# Patient Record
Sex: Male | Born: 1940
Health system: Southern US, Community
[De-identification: ages and names within clinical notes are randomized; demographics above are authoritative.]

## PROBLEM LIST (undated history)

## (undated) DIAGNOSIS — L02215 Cutaneous abscess of perineum: Secondary | ICD-10-CM

## (undated) DIAGNOSIS — H579 Unspecified disorder of eye and adnexa: Secondary | ICD-10-CM

## (undated) HISTORY — PX: ABSCESS DRAINAGE: SHX1119

## (undated) HISTORY — DX: Cutaneous abscess of perineum: L02.215

## (undated) HISTORY — DX: Unspecified disorder of eye and adnexa: H57.9

## (undated) HISTORY — PX: EYE SURGERY: SHX253

---

## 2010-12-26 ENCOUNTER — Ambulatory Visit (INDEPENDENT_AMBULATORY_CARE_PROVIDER_SITE_OTHER): Payer: Medicare Other | Admitting: Surgery

## 2010-12-26 ENCOUNTER — Encounter (INDEPENDENT_AMBULATORY_CARE_PROVIDER_SITE_OTHER): Payer: Self-pay | Admitting: Surgery

## 2010-12-26 VITALS — BP 118/64 | HR 56 | Temp 97.4°F | Resp 16 | Ht 71.0 in | Wt 180.0 lb

## 2010-12-26 DIAGNOSIS — K612 Anorectal abscess: Secondary | ICD-10-CM

## 2010-12-26 DIAGNOSIS — K611 Rectal abscess: Secondary | ICD-10-CM | POA: Insufficient documentation

## 2010-12-26 MED ORDER — HYDROCODONE-ACETAMINOPHEN 10-325 MG PO TABS: 1.0000 | ORAL_TABLET | Freq: Four times a day (QID) | ORAL | Status: AC | PRN

## 2010-12-26 NOTE — Patient Instructions (Addendum)
Peri-Rectal Abscess (Abscess Around the Rectum)  See Handouts.  Call if worse  Your caregiver has diagnosed you as having a peri-rectal abscess. This is an infected area near the rectum that is filled with pus. If the abscess is near the surface of the skin, your caregiver may open (incise) the area and drain the pus. HOME CARE INSTRUCTIONS  If your abscess was opened up and drained. A small piece of gauze may be placed in the opening so that it can drain. Do not remove the gauze unless directed by your caregiver.   A loose dressing may be placed over the abscess site. Change the dressing as often as necessary to keep it clean and dry.   After the drain is removed, the area may be washed with a gentle antiseptic (soap) four times per day.   A warm sitz bath, warm packs or heating pad may be used for pain relief, taking care not to burn yourself.   Return for a wound check in 1 week  An "inflatable doughnut" may be used for sitting with added comfort. These can be purchased at a drugstore or medical supply house.   To reduce pain and straining with bowel movements, eat a high fiber diet with plenty of fruits and vegetables. Use stool softeners as recommended by your caregiver. This is especially important if narcotic type pain medications were prescribed as these may cause marked constipation.   Only take over-the-counter or prescription medicines for pain, discomfort, or fever as directed by your caregiver.  SEEK IMMEDIATE MEDICAL CARE IF:  You have increasing pain that is not controlled by medication.   There is increased inflammation (redness), swelling, bleeding, or drainage from the area.   An oral temperature above 102F develops.   You develop chills or generalized malaise (feel lethargic or feel "washed out").   You develop any new symptoms (problems) you feel may be related to your present problem.  Document Released: 03/16/2000 Document Re-Released: 08/05/2008 [facility]  Patient Information 2011 [place], [place].

## 2010-12-26 NOTE — Progress Notes (Signed)
Subjective:     Patient ID: Christopher Sandoval, male   DOB: [birth year], 70 y.o.   MRN: [id number]  HPI  Reason for visit: Painful draining mass in perineum.  Patient Care Team: Thayer Headings as PCP - General (Internal Medicine)  Patient is a diabetic male who noticed a lump behind his scrotum last week. It was not acutely painful. It was about the size of a Roma tomato. He was hoping it would calm down. It became larger. It started draining. He saw his primary care physician yesterday. He was started on oral antibiotics. He comes today for surgical evaluation. He's never had any history of any abscesses or infections or skin problems in the past. No history of MRSA. Normally has a bowel movement rather regularly.   Past Medical History  Diagnosis Date  . Diabetes mellitus   . Eye pressure     increased  . Perineal abscess     Past Surgical History  Procedure Date  . Eye surgery 2009, 2012    bilateral- cataract, cornea scrape on left    History   Social History  . Marital Status: Married    Spouse Name: N/A    Number of Children: N/A  . Years of Education: N/A   Occupational History  . Not on file.   Social History Main Topics  . Smoking status: Never Smoker   . Smokeless tobacco: Not on file  . Alcohol Use: No  . Drug Use: No  . Sexually Active:    Other Topics Concern  . Not on file   Social History Narrative  . No narrative on file    History reviewed. No pertinent family history.  Current outpatient prescriptions:amoxicillin-clavulanate (AUGMENTIN) 875-125 MG per tablet, Take 1 tablet by mouth 2 (two) times daily.  , Disp: , Rfl: ;  Atorvastatin Calcium (LIPITOR PO), Take by mouth.  , Disp: , Rfl: ;  glyBURIDE (DIABETA) 5 MG tablet, Take 5 mg by mouth daily with breakfast.  , Disp: , Rfl: ;  latanoprost (XALATAN) 0.005 % ophthalmic solution, Place 1 drop into both eyes at bedtime.  , Disp: , Rfl:  lisinopril (PRINIVIL,ZESTRIL) 2.5 MG tablet, Take 2.5 mg by  mouth daily.  , Disp: , Rfl: ;  HYDROcodone-acetaminophen (NORCO) 10-325 MG per tablet, Take 1-2 tablets by mouth every 6 (six) hours as needed for pain., Disp: 30 tablet, Rfl: 0  No Known Allergies        Review of Systems  Constitutional: Negative for fever, chills and diaphoresis.  HENT: Negative for sore throat, trouble swallowing and neck pain.   Eyes: Negative for photophobia and visual disturbance.  Respiratory: Negative for choking and shortness of breath.   Cardiovascular: Negative for chest pain and palpitations.  Gastrointestinal: Negative for nausea, vomiting, abdominal pain, diarrhea, constipation, abdominal distention and anal bleeding.  Genitourinary: Negative for dysuria, urgency, penile swelling, scrotal swelling, difficulty urinating, penile pain and testicular pain.  Musculoskeletal: Negative for myalgias, arthralgias and gait problem.  Skin: Negative for color change and rash.  Neurological: Negative for dizziness, speech difficulty, weakness and numbness.  Hematological: Negative for adenopathy.  Psychiatric/Behavioral: Negative for hallucinations, confusion and agitation.       Objective:   Physical Exam  Constitutional: He is oriented to person, place, and time. He appears well-developed and well-nourished. No distress.  HENT:  Head: Normocephalic.  Mouth/Throat: Oropharynx is clear and moist. No oropharyngeal exudate.  Eyes: Conjunctivae and EOM are normal. Pupils are equal, round, and reactive to light.  No scleral icterus.  Neck: Normal range of motion. No tracheal deviation present.  Cardiovascular: Normal rate and intact distal pulses.   Pulmonary/Chest: Effort normal. No respiratory distress.  Abdominal: Soft. He exhibits no distension and no mass. There is no tenderness. There is no guarding. Hernia confirmed negative in the right inguinal area and confirmed negative in the left inguinal area.       No hernias  Genitourinary: Rectum normal and penis  normal. No penile tenderness.       Left anterior 5x9cm mass +drainage  The anatomy & physiology of the anorectal region was discussed.  The pathophysiology of anorectal abscess and differential diagnosis was discussed.  Natural history progression such as fistula, gangrene, etc was discussed.   I stressed the importance of a bowel regimen to have daily soft bowel movements to minimize progression of disease.     The patient's symptoms are not adequately controlled.  Non-operative treatment has not healed the abscess.  Therefore, I recommended incision & drainage of the abscess to allow the infection to resolve and heal.  Technique, risks, benefits, alternatives discussed.  The patient expressed understanding & wished to proceed.  The patient was positioned lateral decubitus.  I placed a field block with local anaesthetic.  I incised the skin over the abscess to release the infection.  3x5cm cavity.  I excised skin at the wound to have an adequate opening 2x1cm for drainage & prevent skin reclosure.  I packed the wound with ribbon NU-Gauze.  The patient tolerated the procedure.   Educational handouts further explaining the pathology, treatment options, and bowel regimen were given.  We will have the patient return to clinic for close follow up to make sure the infection heals    Musculoskeletal: Normal range of motion. He exhibits no tenderness.  Neurological: He is alert and oriented to person, place, and time. No cranial nerve deficit. He exhibits normal muscle tone. Coordination normal.  Skin: Skin is warm and dry. No rash noted. He is not diaphoretic.  Psychiatric: He has a normal mood and affect. His behavior is normal.       Assessment:     Perirectal abscess in DM patient, drained    Plan:     Wash perineum at least daily. Clean gauze/pads to catch drainage.  Allow packing to fall out over the next few days. Remove it remains greater than 5 days.  Return to clinic in one week to  make sure he is improved.  Complete oral antibiotics.  Return to clinic sooner or call if worsening pain redness or other concerns. I gave him handouts

## 2011-01-01 ENCOUNTER — Ambulatory Visit (INDEPENDENT_AMBULATORY_CARE_PROVIDER_SITE_OTHER): Payer: Medicare Other | Admitting: Surgery

## 2011-01-01 ENCOUNTER — Encounter (INDEPENDENT_AMBULATORY_CARE_PROVIDER_SITE_OTHER): Payer: Self-pay | Admitting: Surgery

## 2011-01-01 VITALS — BP 108/68 | HR 60 | Temp 96.4°F | Resp 16 | Ht 70.0 in | Wt 183.1 lb

## 2011-01-01 DIAGNOSIS — K611 Rectal abscess: Secondary | ICD-10-CM

## 2011-01-01 DIAGNOSIS — K612 Anorectal abscess: Secondary | ICD-10-CM

## 2011-01-01 NOTE — Patient Instructions (Signed)
Dressing Change     Dressings are placed over wounds to keep them clean, dry, and protected from further injury. This provides an environment that favors wound healing. Good wound care includes resting and elevating the injured part until the pain and swelling are better. Change your wound dressing as recommended by your caregiver.     When removing an old dressing, lift it slowly away from the wound. If the dressing sticks to the wound, dampen it with half-strength peroxide or tap water. Clean the wound gently with a moist cloth, remove any loose material, and apply antibiotic ointment if recommended by your caregiver. Usually it is okay for a wound to get wet. Wash it with mildly soapy water. Watch for signs of infection when changing a dressing.     SEEK MEDICAL CARE IF YOU DEVELOP:   Increased pain, redness, or swelling.   Pus-like drainage from the wound.    Fever greater than 100.4 F (38 C).     Document Released: 04/26/2004  Document Re-Released: 06/26/2007  [employer] Patient Information 2011 [employer].

## 2011-01-01 NOTE — Progress Notes (Signed)
Subjective:     Patient ID: Christopher Sandoval, male   DOB: Apr 08, 1940, 70 y.o.   MRN: [id number]  HPI   Reason for visit: Painful draining mass in perineum.  Patient Care Team: Thayer Headings as PCP - General (Internal Medicine)  Diagnosis: Left anterior perirectal abscess  Procedure: Incision and drainage 12/26/2010  Reason for visit: Followup  Patient is a diabetic male who noticed a lump behind his scrotum.  I lanced it last week. I started him on antibiotics. He comes back for a wound check.  He feels much better. He still notes an open wound. Minimal drainage. Regular bowel movements. No fevers chills or sweats. Urinating well. He's to travel to [place] next week and wants to know if that is okay.   Past Medical History  Diagnosis Date  . Diabetes mellitus   . Eye pressure     increased  . Perineal abscess     Past Surgical History  Procedure Date  . Eye surgery 2009, 2012    bilateral- cataract, cornea scrape on left  . Abscess drainage     perirectal     History   Social History  . Marital Status: Married    Spouse Name: N/A    Number of Children: N/A  . Years of Education: N/A   Occupational History  . Not on file.   Social History Main Topics  . Smoking status: Never Smoker   . Smokeless tobacco: Not on file  . Alcohol Use: No  . Drug Use: No  . Sexually Active:    Other Topics Concern  . Not on file   Social History Narrative  . No narrative on file    History reviewed. No pertinent family history.  Current outpatient prescriptions:amoxicillin-clavulanate (AUGMENTIN) 875-125 MG per tablet, Take 1 tablet by mouth 2 (two) times daily.  , Disp: , Rfl: ;  Atorvastatin Calcium (LIPITOR PO), Take by mouth.  , Disp: , Rfl: ;  glyBURIDE (DIABETA) 5 MG tablet, Take 5 mg by mouth daily with breakfast.  , Disp: , Rfl:  HYDROcodone-acetaminophen (NORCO) 10-325 MG per tablet, Take 1-2 tablets by mouth every 6 (six) hours as needed for pain., Disp: 30 tablet,  Rfl: 0;  lisinopril (PRINIVIL,ZESTRIL) 2.5 MG tablet, Take 2.5 mg by mouth daily.  , Disp: , Rfl: ;  latanoprost (XALATAN) 0.005 % ophthalmic solution, Place 1 drop into both eyes at bedtime.  , Disp: , Rfl:   No Known Allergies        Review of Systems  Constitutional: Negative for fever, chills and diaphoresis.  HENT: Negative for sore throat, trouble swallowing and neck pain.   Eyes: Negative for photophobia and visual disturbance.  Respiratory: Negative for choking and shortness of breath.   Cardiovascular: Negative for chest pain and palpitations.  Gastrointestinal: Negative for nausea, vomiting, abdominal pain, diarrhea, constipation, abdominal distention and anal bleeding.  Genitourinary: Negative for dysuria, urgency, penile swelling, scrotal swelling, difficulty urinating, penile pain and testicular pain.  Musculoskeletal: Negative for myalgias, arthralgias and gait problem.  Skin: Negative for color change and rash.  Neurological: Negative for dizziness, speech difficulty, weakness and numbness.  Hematological: Negative for adenopathy.  Psychiatric/Behavioral: Negative for hallucinations, confusion and agitation.       Objective:   Physical Exam  Constitutional: He is oriented to person, place, and time. He appears well-developed and well-nourished. No distress.  HENT:  Head: Normocephalic.  Mouth/Throat: Oropharynx is clear and moist. No oropharyngeal exudate.  Eyes: Conjunctivae and EOM are  normal. Pupils are equal, round, and reactive to light. No scleral icterus.  Neck: Normal range of motion. No tracheal deviation present.  Cardiovascular: Normal rate and intact distal pulses.   Pulmonary/Chest: Effort normal. No respiratory distress.  Abdominal: Soft. He exhibits no distension and no mass. There is no tenderness. There is no guarding. Hernia confirmed negative in the right inguinal area and confirmed negative in the left inguinal area.       No hernias    Genitourinary: Rectum normal and penis normal. No penile tenderness.       2x2cm superficial well-granulating wound left anterior.  Erythema nearly gone  Musculoskeletal: Normal range of motion. He exhibits no tenderness.  Neurological: He is alert and oriented to person, place, and time. No cranial nerve deficit. He exhibits normal muscle tone. Coordination normal.  Skin: Skin is warm and dry. No rash noted. He is not diaphoretic.  Psychiatric: He has a normal mood and affect. His behavior is normal.       Assessment:     Perirectal abscess in DM patient, drained    Plan:     Wash perineum at least daily. Clean gauze/pads to catch drainage.   Return to clinic in 2 weeks to make sure he is improved.  Complete oral antibiotics, last dose tnight.  Return to clinic sooner or call if worsening pain redness or other concerns. I gave him handouts

## 2011-01-15 ENCOUNTER — Encounter (INDEPENDENT_AMBULATORY_CARE_PROVIDER_SITE_OTHER): Payer: Self-pay | Admitting: Surgery

## 2011-01-15 ENCOUNTER — Ambulatory Visit (INDEPENDENT_AMBULATORY_CARE_PROVIDER_SITE_OTHER): Payer: Medicare Other | Admitting: Surgery

## 2011-01-15 VITALS — BP 130/76 | HR 80 | Temp 97.6°F | Resp 18 | Ht 71.0 in | Wt 181.0 lb

## 2011-01-15 DIAGNOSIS — K611 Rectal abscess: Secondary | ICD-10-CM

## 2011-01-15 DIAGNOSIS — K612 Anorectal abscess: Secondary | ICD-10-CM

## 2011-01-15 NOTE — Progress Notes (Signed)
Subjective:     Patient ID: Christopher Sandoval, male   DOB: 10/04/1940, 70 y.o.   MRN: [id number]  HPI   Reason for visit: Painful draining mass in perineum.  Patient Care Team: Thayer Headings as PCP - General (Internal Medicine)  Diagnosis: Left anterior perirectal abscess  Procedure: Incision and drainage 12/26/2010  Reason for visit: Followup  He feels much better. He still notes the wound is nearly closed down. Minimal drainage. Using dry gauze to cover.  Regular bowel movements. No fevers chills or sweats. Urinating well.   Past Medical History  Diagnosis Date  . Diabetes mellitus   . Eye pressure     increased  . Perineal abscess     Past Surgical History  Procedure Date  . Eye surgery 2009, 2012    bilateral- cataract, cornea scrape on left  . Abscess drainage     perirectal     History   Social History  . Marital Status: Married    Spouse Name: N/A    Number of Children: N/A  . Years of Education: N/A   Occupational History  . Not on file.   Social History Main Topics  . Smoking status: Never Smoker   . Smokeless tobacco: Never Used  . Alcohol Use: No  . Drug Use: No  . Sexually Active:    Other Topics Concern  . Not on file   Social History Narrative  . No narrative on file    History reviewed. No pertinent family history.  Current outpatient prescriptions:Atorvastatin Calcium (LIPITOR PO), Take by mouth.  , Disp: , Rfl: ;  glyBURIDE (DIABETA) 5 MG tablet, Take 5 mg by mouth daily with breakfast.  , Disp: , Rfl: ;  HYDROcodone-acetaminophen (NORCO) 10-325 MG per tablet, Take 1-2 tablets by mouth every 6 (six) hours as needed for pain., Disp: 30 tablet, Rfl: 0;  latanoprost (XALATAN) 0.005 % ophthalmic solution, Place 1 drop into both eyes at bedtime.  , Disp: , Rfl:  lisinopril (PRINIVIL,ZESTRIL) 2.5 MG tablet, Take 2.5 mg by mouth daily.  , Disp: , Rfl:   No Known Allergies        Review of Systems  Constitutional: Negative for fever,  chills and diaphoresis.  HENT: Negative for sore throat, trouble swallowing and neck pain.   Eyes: Negative for photophobia and visual disturbance.  Respiratory: Negative for choking and shortness of breath.   Cardiovascular: Negative for chest pain and palpitations.  Gastrointestinal: Negative for nausea, vomiting, abdominal pain, diarrhea, constipation, abdominal distention and anal bleeding.  Genitourinary: Negative for dysuria, urgency, penile swelling, scrotal swelling, difficulty urinating, penile pain and testicular pain.  Musculoskeletal: Negative for myalgias, arthralgias and gait problem.  Skin: Negative for color change and rash.  Neurological: Negative for dizziness, speech difficulty, weakness and numbness.  Hematological: Negative for adenopathy.  Psychiatric/Behavioral: Negative for hallucinations, confusion and agitation.       Objective:   Physical Exam  Constitutional: He is oriented to person, place, and time. He appears well-developed and well-nourished. No distress.  HENT:  Head: Normocephalic.  Mouth/Throat: Oropharynx is clear and moist. No oropharyngeal exudate.  Eyes: Conjunctivae and EOM are normal. Pupils are equal, round, and reactive to light. No scleral icterus.  Neck: Normal range of motion. No tracheal deviation present.  Cardiovascular: Normal rate and intact distal pulses.   Pulmonary/Chest: Effort normal. No respiratory distress.  Abdominal: Soft. He exhibits no distension and no mass. There is no tenderness. There is no guarding. Hernia confirmed negative in  the right inguinal area and confirmed negative in the left inguinal area.       No hernias  Genitourinary: Rectum normal and penis normal. No penile tenderness.       1x1cm superficial nearly epithelialized wound left anterior.  Soft.  No cellulitis/abscess  Musculoskeletal: Normal range of motion. He exhibits no tenderness.  Neurological: He is alert and oriented to person, place, and time. No  cranial nerve deficit. He exhibits normal muscle tone. Coordination normal.  Skin: Skin is warm and dry. No rash noted. He is not diaphoretic.  Psychiatric: He has a normal mood and affect. His behavior is normal.       Assessment:     Perirectal abscess in DM patient, drained.  Nearly closed 3 weeks later    Plan:     Wash perineum at least daily. Clean gauze/pads to catch drainage.   Return to clinic PRN, call if  worsening pain. redness or other concerns.   He expressed understanding and appreciation.

## 2011-04-17 DIAGNOSIS — H4011X0 Primary open-angle glaucoma, stage unspecified: Secondary | ICD-10-CM | POA: Diagnosis not present

## 2011-05-10 DIAGNOSIS — E119 Type 2 diabetes mellitus without complications: Secondary | ICD-10-CM | POA: Diagnosis not present

## 2011-05-10 DIAGNOSIS — E785 Hyperlipidemia, unspecified: Secondary | ICD-10-CM | POA: Diagnosis not present

## 2011-05-11 DIAGNOSIS — H26499 Other secondary cataract, unspecified eye: Secondary | ICD-10-CM | POA: Diagnosis not present

## 2011-05-11 DIAGNOSIS — Z961 Presence of intraocular lens: Secondary | ICD-10-CM | POA: Diagnosis not present

## 2011-05-16 DIAGNOSIS — E119 Type 2 diabetes mellitus without complications: Secondary | ICD-10-CM | POA: Diagnosis not present

## 2011-06-13 DIAGNOSIS — R0602 Shortness of breath: Secondary | ICD-10-CM | POA: Diagnosis not present

## 2011-06-13 DIAGNOSIS — N529 Male erectile dysfunction, unspecified: Secondary | ICD-10-CM | POA: Diagnosis not present

## 2011-06-13 DIAGNOSIS — E785 Hyperlipidemia, unspecified: Secondary | ICD-10-CM | POA: Diagnosis not present

## 2011-06-13 DIAGNOSIS — E119 Type 2 diabetes mellitus without complications: Secondary | ICD-10-CM | POA: Diagnosis not present

## 2011-07-26 DIAGNOSIS — R3129 Other microscopic hematuria: Secondary | ICD-10-CM | POA: Diagnosis not present

## 2011-07-26 DIAGNOSIS — N281 Cyst of kidney, acquired: Secondary | ICD-10-CM | POA: Diagnosis not present

## 2011-08-13 DIAGNOSIS — B070 Plantar wart: Secondary | ICD-10-CM | POA: Diagnosis not present

## 2011-08-21 DIAGNOSIS — E119 Type 2 diabetes mellitus without complications: Secondary | ICD-10-CM | POA: Diagnosis not present

## 2011-08-22 DIAGNOSIS — E119 Type 2 diabetes mellitus without complications: Secondary | ICD-10-CM | POA: Diagnosis not present

## 2011-08-29 DIAGNOSIS — B079 Viral wart, unspecified: Secondary | ICD-10-CM | POA: Diagnosis not present

## 2011-09-05 DIAGNOSIS — R0602 Shortness of breath: Secondary | ICD-10-CM | POA: Diagnosis not present

## 2011-11-16 DIAGNOSIS — H1130 Conjunctival hemorrhage, unspecified eye: Secondary | ICD-10-CM | POA: Diagnosis not present

## 2011-12-14 DIAGNOSIS — E785 Hyperlipidemia, unspecified: Secondary | ICD-10-CM | POA: Diagnosis not present

## 2011-12-14 DIAGNOSIS — Z0000 Encounter for general adult medical examination without abnormal findings: Secondary | ICD-10-CM | POA: Diagnosis not present

## 2011-12-14 DIAGNOSIS — E119 Type 2 diabetes mellitus without complications: Secondary | ICD-10-CM | POA: Diagnosis not present

## 2011-12-14 DIAGNOSIS — E663 Overweight: Secondary | ICD-10-CM | POA: Diagnosis not present

## 2011-12-21 DIAGNOSIS — N529 Male erectile dysfunction, unspecified: Secondary | ICD-10-CM | POA: Diagnosis not present

## 2011-12-21 DIAGNOSIS — E119 Type 2 diabetes mellitus without complications: Secondary | ICD-10-CM | POA: Diagnosis not present

## 2011-12-21 DIAGNOSIS — E785 Hyperlipidemia, unspecified: Secondary | ICD-10-CM | POA: Diagnosis not present

## 2012-01-15 DIAGNOSIS — E119 Type 2 diabetes mellitus without complications: Secondary | ICD-10-CM | POA: Diagnosis not present

## 2012-01-16 DIAGNOSIS — E119 Type 2 diabetes mellitus without complications: Secondary | ICD-10-CM | POA: Diagnosis not present

## 2012-01-25 DIAGNOSIS — Z23 Encounter for immunization: Secondary | ICD-10-CM | POA: Diagnosis not present

## 2012-02-01 DIAGNOSIS — H4011X0 Primary open-angle glaucoma, stage unspecified: Secondary | ICD-10-CM | POA: Diagnosis not present

## 2012-02-01 DIAGNOSIS — H35379 Puckering of macula, unspecified eye: Secondary | ICD-10-CM | POA: Diagnosis not present

## 2012-02-01 DIAGNOSIS — H43819 Vitreous degeneration, unspecified eye: Secondary | ICD-10-CM | POA: Diagnosis not present

## 2012-02-01 DIAGNOSIS — Z09 Encounter for follow-up examination after completed treatment for conditions other than malignant neoplasm: Secondary | ICD-10-CM | POA: Diagnosis not present

## 2012-02-08 DIAGNOSIS — B079 Viral wart, unspecified: Secondary | ICD-10-CM | POA: Diagnosis not present

## 2012-02-08 DIAGNOSIS — L851 Acquired keratosis [keratoderma] palmaris et plantaris: Secondary | ICD-10-CM | POA: Diagnosis not present

## 2012-04-28 DIAGNOSIS — E119 Type 2 diabetes mellitus without complications: Secondary | ICD-10-CM | POA: Diagnosis not present

## 2012-04-28 DIAGNOSIS — H4011X0 Primary open-angle glaucoma, stage unspecified: Secondary | ICD-10-CM | POA: Diagnosis not present

## 2012-05-20 DIAGNOSIS — E119 Type 2 diabetes mellitus without complications: Secondary | ICD-10-CM | POA: Diagnosis not present

## 2012-05-21 DIAGNOSIS — E119 Type 2 diabetes mellitus without complications: Secondary | ICD-10-CM | POA: Diagnosis not present

## 2012-06-16 DIAGNOSIS — H4011X0 Primary open-angle glaucoma, stage unspecified: Secondary | ICD-10-CM | POA: Diagnosis not present

## 2012-07-04 DIAGNOSIS — R197 Diarrhea, unspecified: Secondary | ICD-10-CM | POA: Diagnosis not present

## 2012-07-04 DIAGNOSIS — A088 Other specified intestinal infections: Secondary | ICD-10-CM | POA: Diagnosis not present

## 2012-07-04 DIAGNOSIS — R112 Nausea with vomiting, unspecified: Secondary | ICD-10-CM | POA: Diagnosis not present

## 2012-07-30 DIAGNOSIS — L821 Other seborrheic keratosis: Secondary | ICD-10-CM | POA: Diagnosis not present

## 2012-07-30 DIAGNOSIS — L819 Disorder of pigmentation, unspecified: Secondary | ICD-10-CM | POA: Diagnosis not present

## 2012-07-30 DIAGNOSIS — L570 Actinic keratosis: Secondary | ICD-10-CM | POA: Diagnosis not present

## 2012-07-30 DIAGNOSIS — L253 Unspecified contact dermatitis due to other chemical products: Secondary | ICD-10-CM | POA: Diagnosis not present

## 2012-07-30 DIAGNOSIS — D485 Neoplasm of uncertain behavior of skin: Secondary | ICD-10-CM | POA: Diagnosis not present

## 2012-08-19 DIAGNOSIS — E119 Type 2 diabetes mellitus without complications: Secondary | ICD-10-CM | POA: Diagnosis not present

## 2012-08-19 DIAGNOSIS — E785 Hyperlipidemia, unspecified: Secondary | ICD-10-CM | POA: Diagnosis not present

## 2012-08-28 DIAGNOSIS — E785 Hyperlipidemia, unspecified: Secondary | ICD-10-CM | POA: Diagnosis not present

## 2012-08-28 DIAGNOSIS — N529 Male erectile dysfunction, unspecified: Secondary | ICD-10-CM | POA: Diagnosis not present

## 2012-08-28 DIAGNOSIS — E1129 Type 2 diabetes mellitus with other diabetic kidney complication: Secondary | ICD-10-CM | POA: Diagnosis not present

## 2012-08-28 DIAGNOSIS — N182 Chronic kidney disease, stage 2 (mild): Secondary | ICD-10-CM | POA: Diagnosis not present

## 2012-09-12 DIAGNOSIS — Q828 Other specified congenital malformations of skin: Secondary | ICD-10-CM | POA: Diagnosis not present

## 2012-09-12 DIAGNOSIS — L84 Corns and callosities: Secondary | ICD-10-CM | POA: Diagnosis not present

## 2012-09-29 DIAGNOSIS — H4011X0 Primary open-angle glaucoma, stage unspecified: Secondary | ICD-10-CM | POA: Diagnosis not present

## 2012-12-22 DIAGNOSIS — Z23 Encounter for immunization: Secondary | ICD-10-CM | POA: Diagnosis not present

## 2013-01-19 DIAGNOSIS — E119 Type 2 diabetes mellitus without complications: Secondary | ICD-10-CM | POA: Diagnosis not present

## 2013-01-21 DIAGNOSIS — E119 Type 2 diabetes mellitus without complications: Secondary | ICD-10-CM | POA: Diagnosis not present

## 2013-02-19 DIAGNOSIS — S0500XA Injury of conjunctiva and corneal abrasion without foreign body, unspecified eye, initial encounter: Secondary | ICD-10-CM | POA: Diagnosis not present

## 2013-02-24 DIAGNOSIS — Z125 Encounter for screening for malignant neoplasm of prostate: Secondary | ICD-10-CM | POA: Diagnosis not present

## 2013-02-24 DIAGNOSIS — E119 Type 2 diabetes mellitus without complications: Secondary | ICD-10-CM | POA: Diagnosis not present

## 2013-03-05 DIAGNOSIS — E119 Type 2 diabetes mellitus without complications: Secondary | ICD-10-CM | POA: Diagnosis not present

## 2013-03-09 DIAGNOSIS — E785 Hyperlipidemia, unspecified: Secondary | ICD-10-CM | POA: Diagnosis not present

## 2013-03-09 DIAGNOSIS — E1129 Type 2 diabetes mellitus with other diabetic kidney complication: Secondary | ICD-10-CM | POA: Diagnosis not present

## 2013-03-09 DIAGNOSIS — N182 Chronic kidney disease, stage 2 (mild): Secondary | ICD-10-CM | POA: Diagnosis not present

## 2013-05-04 DIAGNOSIS — E119 Type 2 diabetes mellitus without complications: Secondary | ICD-10-CM | POA: Diagnosis not present

## 2013-05-04 DIAGNOSIS — H4011X0 Primary open-angle glaucoma, stage unspecified: Secondary | ICD-10-CM | POA: Diagnosis not present

## 2013-05-07 DIAGNOSIS — E119 Type 2 diabetes mellitus without complications: Secondary | ICD-10-CM | POA: Diagnosis not present

## 2013-05-07 DIAGNOSIS — J019 Acute sinusitis, unspecified: Secondary | ICD-10-CM | POA: Diagnosis not present

## 2013-06-01 DIAGNOSIS — E119 Type 2 diabetes mellitus without complications: Secondary | ICD-10-CM | POA: Diagnosis not present

## 2013-06-02 DIAGNOSIS — H26499 Other secondary cataract, unspecified eye: Secondary | ICD-10-CM | POA: Diagnosis not present

## 2013-06-02 DIAGNOSIS — E119 Type 2 diabetes mellitus without complications: Secondary | ICD-10-CM | POA: Diagnosis not present

## 2013-06-02 DIAGNOSIS — Z961 Presence of intraocular lens: Secondary | ICD-10-CM | POA: Diagnosis not present

## 2013-06-02 DIAGNOSIS — H18419 Arcus senilis, unspecified eye: Secondary | ICD-10-CM | POA: Diagnosis not present

## 2013-06-10 DIAGNOSIS — E119 Type 2 diabetes mellitus without complications: Secondary | ICD-10-CM | POA: Diagnosis not present

## 2013-06-23 DIAGNOSIS — H26499 Other secondary cataract, unspecified eye: Secondary | ICD-10-CM | POA: Diagnosis not present

## 2013-09-02 DIAGNOSIS — N182 Chronic kidney disease, stage 2 (mild): Secondary | ICD-10-CM | POA: Diagnosis not present

## 2013-09-02 DIAGNOSIS — E785 Hyperlipidemia, unspecified: Secondary | ICD-10-CM | POA: Diagnosis not present

## 2013-09-02 DIAGNOSIS — E1129 Type 2 diabetes mellitus with other diabetic kidney complication: Secondary | ICD-10-CM | POA: Diagnosis not present

## 2013-09-08 DIAGNOSIS — E785 Hyperlipidemia, unspecified: Secondary | ICD-10-CM | POA: Diagnosis not present

## 2013-09-08 DIAGNOSIS — N529 Male erectile dysfunction, unspecified: Secondary | ICD-10-CM | POA: Diagnosis not present

## 2013-09-08 DIAGNOSIS — N182 Chronic kidney disease, stage 2 (mild): Secondary | ICD-10-CM | POA: Diagnosis not present

## 2013-09-08 DIAGNOSIS — E1129 Type 2 diabetes mellitus with other diabetic kidney complication: Secondary | ICD-10-CM | POA: Diagnosis not present

## 2013-09-16 DIAGNOSIS — E119 Type 2 diabetes mellitus without complications: Secondary | ICD-10-CM | POA: Diagnosis not present

## 2013-12-18 DIAGNOSIS — Z23 Encounter for immunization: Secondary | ICD-10-CM | POA: Diagnosis not present

## 2013-12-30 DIAGNOSIS — D045 Carcinoma in situ of skin of trunk: Secondary | ICD-10-CM | POA: Diagnosis not present

## 2013-12-30 DIAGNOSIS — L570 Actinic keratosis: Secondary | ICD-10-CM | POA: Diagnosis not present

## 2013-12-30 DIAGNOSIS — L821 Other seborrheic keratosis: Secondary | ICD-10-CM | POA: Diagnosis not present

## 2013-12-30 DIAGNOSIS — D485 Neoplasm of uncertain behavior of skin: Secondary | ICD-10-CM | POA: Diagnosis not present

## 2014-01-04 DIAGNOSIS — R809 Proteinuria, unspecified: Secondary | ICD-10-CM | POA: Diagnosis not present

## 2014-01-06 DIAGNOSIS — E119 Type 2 diabetes mellitus without complications: Secondary | ICD-10-CM | POA: Diagnosis not present

## 2014-03-04 DIAGNOSIS — Z125 Encounter for screening for malignant neoplasm of prostate: Secondary | ICD-10-CM | POA: Diagnosis not present

## 2014-03-04 DIAGNOSIS — Z1389 Encounter for screening for other disorder: Secondary | ICD-10-CM | POA: Diagnosis not present

## 2014-03-04 DIAGNOSIS — E1121 Type 2 diabetes mellitus with diabetic nephropathy: Secondary | ICD-10-CM | POA: Diagnosis not present

## 2014-03-04 DIAGNOSIS — E785 Hyperlipidemia, unspecified: Secondary | ICD-10-CM | POA: Diagnosis not present

## 2014-03-04 DIAGNOSIS — Z0000 Encounter for general adult medical examination without abnormal findings: Secondary | ICD-10-CM | POA: Diagnosis not present

## 2014-03-11 DIAGNOSIS — E1121 Type 2 diabetes mellitus with diabetic nephropathy: Secondary | ICD-10-CM | POA: Diagnosis not present

## 2014-03-11 DIAGNOSIS — E785 Hyperlipidemia, unspecified: Secondary | ICD-10-CM | POA: Diagnosis not present

## 2014-03-11 DIAGNOSIS — N182 Chronic kidney disease, stage 2 (mild): Secondary | ICD-10-CM | POA: Diagnosis not present

## 2014-03-11 DIAGNOSIS — N529 Male erectile dysfunction, unspecified: Secondary | ICD-10-CM | POA: Diagnosis not present

## 2014-03-22 DIAGNOSIS — M4727 Other spondylosis with radiculopathy, lumbosacral region: Secondary | ICD-10-CM | POA: Diagnosis not present

## 2014-03-22 DIAGNOSIS — M9903 Segmental and somatic dysfunction of lumbar region: Secondary | ICD-10-CM | POA: Diagnosis not present

## 2014-03-23 DIAGNOSIS — M9903 Segmental and somatic dysfunction of lumbar region: Secondary | ICD-10-CM | POA: Diagnosis not present

## 2014-03-23 DIAGNOSIS — M4727 Other spondylosis with radiculopathy, lumbosacral region: Secondary | ICD-10-CM | POA: Diagnosis not present

## 2014-03-24 DIAGNOSIS — M4727 Other spondylosis with radiculopathy, lumbosacral region: Secondary | ICD-10-CM | POA: Diagnosis not present

## 2014-03-24 DIAGNOSIS — M9903 Segmental and somatic dysfunction of lumbar region: Secondary | ICD-10-CM | POA: Diagnosis not present

## 2014-03-29 DIAGNOSIS — M9903 Segmental and somatic dysfunction of lumbar region: Secondary | ICD-10-CM | POA: Diagnosis not present

## 2014-03-29 DIAGNOSIS — M4727 Other spondylosis with radiculopathy, lumbosacral region: Secondary | ICD-10-CM | POA: Diagnosis not present

## 2014-03-30 DIAGNOSIS — M9903 Segmental and somatic dysfunction of lumbar region: Secondary | ICD-10-CM | POA: Diagnosis not present

## 2014-03-30 DIAGNOSIS — M4727 Other spondylosis with radiculopathy, lumbosacral region: Secondary | ICD-10-CM | POA: Diagnosis not present

## 2014-03-31 DIAGNOSIS — M4727 Other spondylosis with radiculopathy, lumbosacral region: Secondary | ICD-10-CM | POA: Diagnosis not present

## 2014-03-31 DIAGNOSIS — M9903 Segmental and somatic dysfunction of lumbar region: Secondary | ICD-10-CM | POA: Diagnosis not present

## 2014-04-05 DIAGNOSIS — M4727 Other spondylosis with radiculopathy, lumbosacral region: Secondary | ICD-10-CM | POA: Diagnosis not present

## 2014-04-05 DIAGNOSIS — M9903 Segmental and somatic dysfunction of lumbar region: Secondary | ICD-10-CM | POA: Diagnosis not present

## 2014-04-06 DIAGNOSIS — M4727 Other spondylosis with radiculopathy, lumbosacral region: Secondary | ICD-10-CM | POA: Diagnosis not present

## 2014-04-06 DIAGNOSIS — M9903 Segmental and somatic dysfunction of lumbar region: Secondary | ICD-10-CM | POA: Diagnosis not present

## 2014-04-07 DIAGNOSIS — M9903 Segmental and somatic dysfunction of lumbar region: Secondary | ICD-10-CM | POA: Diagnosis not present

## 2014-04-07 DIAGNOSIS — M4727 Other spondylosis with radiculopathy, lumbosacral region: Secondary | ICD-10-CM | POA: Diagnosis not present

## 2014-04-13 DIAGNOSIS — E119 Type 2 diabetes mellitus without complications: Secondary | ICD-10-CM | POA: Diagnosis not present

## 2014-04-15 DIAGNOSIS — M4727 Other spondylosis with radiculopathy, lumbosacral region: Secondary | ICD-10-CM | POA: Diagnosis not present

## 2014-04-15 DIAGNOSIS — M9903 Segmental and somatic dysfunction of lumbar region: Secondary | ICD-10-CM | POA: Diagnosis not present

## 2014-04-20 DIAGNOSIS — M9903 Segmental and somatic dysfunction of lumbar region: Secondary | ICD-10-CM | POA: Diagnosis not present

## 2014-04-20 DIAGNOSIS — M4727 Other spondylosis with radiculopathy, lumbosacral region: Secondary | ICD-10-CM | POA: Diagnosis not present

## 2014-04-21 DIAGNOSIS — M4727 Other spondylosis with radiculopathy, lumbosacral region: Secondary | ICD-10-CM | POA: Diagnosis not present

## 2014-04-21 DIAGNOSIS — M9903 Segmental and somatic dysfunction of lumbar region: Secondary | ICD-10-CM | POA: Diagnosis not present

## 2014-04-26 DIAGNOSIS — M9903 Segmental and somatic dysfunction of lumbar region: Secondary | ICD-10-CM | POA: Diagnosis not present

## 2014-04-26 DIAGNOSIS — M4727 Other spondylosis with radiculopathy, lumbosacral region: Secondary | ICD-10-CM | POA: Diagnosis not present

## 2014-04-28 DIAGNOSIS — M9903 Segmental and somatic dysfunction of lumbar region: Secondary | ICD-10-CM | POA: Diagnosis not present

## 2014-04-28 DIAGNOSIS — M4727 Other spondylosis with radiculopathy, lumbosacral region: Secondary | ICD-10-CM | POA: Diagnosis not present

## 2014-05-03 DIAGNOSIS — M9903 Segmental and somatic dysfunction of lumbar region: Secondary | ICD-10-CM | POA: Diagnosis not present

## 2014-05-03 DIAGNOSIS — M4727 Other spondylosis with radiculopathy, lumbosacral region: Secondary | ICD-10-CM | POA: Diagnosis not present

## 2014-05-04 DIAGNOSIS — H04129 Dry eye syndrome of unspecified lacrimal gland: Secondary | ICD-10-CM | POA: Diagnosis not present

## 2014-06-18 DIAGNOSIS — E1122 Type 2 diabetes mellitus with diabetic chronic kidney disease: Secondary | ICD-10-CM | POA: Diagnosis not present

## 2014-07-21 DIAGNOSIS — E119 Type 2 diabetes mellitus without complications: Secondary | ICD-10-CM | POA: Diagnosis not present

## 2014-09-20 DIAGNOSIS — E1121 Type 2 diabetes mellitus with diabetic nephropathy: Secondary | ICD-10-CM | POA: Diagnosis not present

## 2014-09-20 DIAGNOSIS — E785 Hyperlipidemia, unspecified: Secondary | ICD-10-CM | POA: Diagnosis not present

## 2014-09-21 DIAGNOSIS — H1131 Conjunctival hemorrhage, right eye: Secondary | ICD-10-CM | POA: Diagnosis not present

## 2014-09-27 DIAGNOSIS — E1122 Type 2 diabetes mellitus with diabetic chronic kidney disease: Secondary | ICD-10-CM | POA: Diagnosis not present

## 2014-09-27 DIAGNOSIS — N182 Chronic kidney disease, stage 2 (mild): Secondary | ICD-10-CM | POA: Diagnosis not present

## 2014-09-27 DIAGNOSIS — G250 Essential tremor: Secondary | ICD-10-CM | POA: Diagnosis not present

## 2014-09-27 DIAGNOSIS — N529 Male erectile dysfunction, unspecified: Secondary | ICD-10-CM | POA: Diagnosis not present

## 2014-12-22 DIAGNOSIS — E119 Type 2 diabetes mellitus without complications: Secondary | ICD-10-CM | POA: Diagnosis not present

## 2014-12-28 DIAGNOSIS — H1131 Conjunctival hemorrhage, right eye: Secondary | ICD-10-CM | POA: Diagnosis not present

## 2014-12-29 ENCOUNTER — Ambulatory Visit (INDEPENDENT_AMBULATORY_CARE_PROVIDER_SITE_OTHER): Payer: Medicare Other | Admitting: Podiatry

## 2014-12-29 ENCOUNTER — Encounter: Payer: Self-pay | Admitting: Podiatry

## 2014-12-29 VITALS — BP 120/66 | HR 48 | Temp 98.9°F | Resp 14

## 2014-12-29 DIAGNOSIS — E119 Type 2 diabetes mellitus without complications: Secondary | ICD-10-CM | POA: Diagnosis not present

## 2014-12-29 DIAGNOSIS — Q828 Other specified congenital malformations of skin: Secondary | ICD-10-CM | POA: Diagnosis not present

## 2014-12-29 NOTE — Patient Instructions (Addendum)
Today her diabetic foot exam revealed adequate pulsations and slightly decreased feeling to the tuning fork The skin lesion on the bottom of the left foot appears to be more consistent with a corn like grow rather than a true warty growth. I recommend periodic trimming of this area Return as needed or yearly for diabetic foot examination  Diabetes and Foot Care Diabetes may cause you to have problems because of poor blood supply (circulation) to your feet and legs. This may cause the skin on your feet to become thinner, break easier, and heal more slowly. Your skin may become dry, and the skin may peel and crack. You may also have nerve damage in your legs and feet causing decreased feeling in them. You may not notice minor injuries to your feet that could lead to infections or more serious problems. Taking care of your feet is one of the most important things you can do for yourself.  HOME CARE INSTRUCTIONS  Wear shoes at all times, even in the house. Do not go barefoot. Bare feet are easily injured.  Check your feet daily for blisters, cuts, and redness. If you cannot see the bottom of your feet, use a mirror or ask someone for help.  Wash your feet with warm water (do not use hot water) and mild soap. Then pat your feet and the areas between your toes until they are completely dry. Do not soak your feet as this can dry your skin.  Apply a moisturizing lotion or petroleum jelly (that does not contain alcohol and is unscented) to the skin on your feet and to dry, brittle toenails. Do not apply lotion between your toes.  Trim your toenails straight across. Do not dig under them or around the cuticle. File the edges of your nails with an emery board or nail file.  Do not cut corns or calluses or try to remove them with medicine.  Wear clean socks or stockings every day. Make sure they are not too tight. Do not wear knee-high stockings since they may decrease blood flow to your legs.  Wear shoes  that fit properly and have enough cushioning. To break in new shoes, wear them for just a few hours a day. This prevents you from injuring your feet. Always look in your shoes before you put them on to be sure there are no objects inside.  Do not cross your legs. This may decrease the blood flow to your feet.  If you find a minor scrape, cut, or break in the skin on your feet, keep it and the skin around it clean and dry. These areas may be cleansed with mild soap and water. Do not cleanse the area with peroxide, alcohol, or iodine.  When you remove an adhesive bandage, be sure not to damage the skin around it.  If you have a wound, look at it several times a day to make sure it is healing.  Do not use heating pads or hot water bottles. They may burn your skin. If you have lost feeling in your feet or legs, you may not know it is happening until it is too late.  Make sure your health care provider performs a complete foot exam at least annually or more often if you have foot problems. Report any cuts, sores, or bruises to your health care provider immediately. SEEK MEDICAL CARE IF:   You have an injury that is not healing.  You have cuts or breaks in the skin.  You  have an ingrown nail.  You notice redness on your legs or feet.  You feel burning or tingling in your legs or feet.  You have pain or cramps in your legs and feet.  Your legs or feet are numb.  Your feet always feel cold. SEEK IMMEDIATE MEDICAL CARE IF:   There is increasing redness, swelling, or pain in or around a wound.  There is a red line that goes up your leg.  Pus is coming from a wound.  You develop a fever or as directed by your health care provider.  You notice a bad smell coming from an ulcer or wound. Document Released: 03/16/2000 Document Revised: 11/19/2012 Document Reviewed: 08/26/2012 [facility] Patient Information 2015 [place], [place]. This information is not intended to replace advice given to you  by your health care provider. Make sure you discuss any questions you have with your health care provider.

## 2014-12-29 NOTE — Progress Notes (Signed)
   Subjective:    Patient ID: Christopher Sandoval, male    DOB: 01/19/1941, 74 y.o.   MRN: [id number]  HPI    This patient presents today complaining of a skin lesion in the plantar left mid foot that has reoccurred in the past 1-1/2 years after removal by Dr. Valentina Lucks in in 2014. Patient has been applying topical wart remover to the area for several months and the lesion has persisted. He has interested in possible removal of this lesion. Patient states that he is quite active on a daily basis and this lesion does not prevent him from performing his daily physical activities which including cycling and weight lifting. Patient is diabetic and denies any history of ulceration, claudication or amputation   Review of Systems  All other systems reviewed and are negative.      Objective:   Physical Exam  Orientated 3  Vascular: No peripheral edema bilaterally DP and PT pulses 2/4 bilaterally Capillary reflex immediate bilaterally  Neurological: Sensation to 10 g monofilament wire intact 5/5 right 4/5 left Vibratory sensation nonreactive bilaterally Ankle reflexes reactive bilaterally  Dermatological: Texture and turgor within normal limits Nucleated plantar keratoses left midfoot several millimeters in diameter. There is slight bleeding within the area. After debridement, however, no further bleeding noted within the lesion  Musculoskeletal: Flexible pes planus bilaterally Crossover second right toe      Assessment & Plan:   Assessment: Satisfactory vascular status Decreased vibratory sensation suggested of mild diabetic neuropathy Protective sensation intact bilaterally Porokeratosis plantar left midfoot  Plan: Today I reviewed the results of the examination today. I made patient aware that his vascular status was adequate. I made aware that his vibratory sensation was diminished with a suggestion of mild neuropathy. Protective sensation intact I informed patient at the skin  lesions seem to be most consistent with a keratoses, possible porokeratosis rather than a wart. I recommended periodic debridement if this lesion became thick and uncomfortable. Patient consents to debridement. The plantar skin lesion was debrided without a bleeding

## 2014-12-31 DIAGNOSIS — L821 Other seborrheic keratosis: Secondary | ICD-10-CM | POA: Diagnosis not present

## 2014-12-31 DIAGNOSIS — L570 Actinic keratosis: Secondary | ICD-10-CM | POA: Diagnosis not present

## 2014-12-31 DIAGNOSIS — Z85828 Personal history of other malignant neoplasm of skin: Secondary | ICD-10-CM | POA: Diagnosis not present

## 2015-01-14 DIAGNOSIS — Z23 Encounter for immunization: Secondary | ICD-10-CM | POA: Diagnosis not present

## 2015-01-18 DIAGNOSIS — Z23 Encounter for immunization: Secondary | ICD-10-CM | POA: Diagnosis not present

## 2015-03-01 DIAGNOSIS — R809 Proteinuria, unspecified: Secondary | ICD-10-CM | POA: Diagnosis not present

## 2015-03-04 DIAGNOSIS — R809 Proteinuria, unspecified: Secondary | ICD-10-CM | POA: Diagnosis not present

## 2015-03-04 DIAGNOSIS — E119 Type 2 diabetes mellitus without complications: Secondary | ICD-10-CM | POA: Diagnosis not present

## 2015-03-30 DIAGNOSIS — Z0000 Encounter for general adult medical examination without abnormal findings: Secondary | ICD-10-CM | POA: Diagnosis not present

## 2015-03-30 DIAGNOSIS — E1122 Type 2 diabetes mellitus with diabetic chronic kidney disease: Secondary | ICD-10-CM | POA: Diagnosis not present

## 2015-03-30 DIAGNOSIS — E663 Overweight: Secondary | ICD-10-CM | POA: Diagnosis not present

## 2015-03-30 DIAGNOSIS — Z125 Encounter for screening for malignant neoplasm of prostate: Secondary | ICD-10-CM | POA: Diagnosis not present

## 2015-03-30 DIAGNOSIS — N182 Chronic kidney disease, stage 2 (mild): Secondary | ICD-10-CM | POA: Diagnosis not present

## 2015-03-30 DIAGNOSIS — N529 Male erectile dysfunction, unspecified: Secondary | ICD-10-CM | POA: Diagnosis not present

## 2015-03-30 DIAGNOSIS — Z1389 Encounter for screening for other disorder: Secondary | ICD-10-CM | POA: Diagnosis not present

## 2015-03-30 DIAGNOSIS — E785 Hyperlipidemia, unspecified: Secondary | ICD-10-CM | POA: Diagnosis not present

## 2015-04-06 ENCOUNTER — Other Ambulatory Visit: Payer: Self-pay | Admitting: Internal Medicine

## 2015-04-06 DIAGNOSIS — I714 Abdominal aortic aneurysm, without rupture, unspecified: Secondary | ICD-10-CM

## 2015-04-06 DIAGNOSIS — G250 Essential tremor: Secondary | ICD-10-CM | POA: Diagnosis not present

## 2015-04-06 DIAGNOSIS — E1122 Type 2 diabetes mellitus with diabetic chronic kidney disease: Secondary | ICD-10-CM | POA: Diagnosis not present

## 2015-04-06 DIAGNOSIS — N182 Chronic kidney disease, stage 2 (mild): Secondary | ICD-10-CM | POA: Diagnosis not present

## 2015-04-06 DIAGNOSIS — E785 Hyperlipidemia, unspecified: Secondary | ICD-10-CM | POA: Diagnosis not present

## 2015-04-06 DIAGNOSIS — N529 Male erectile dysfunction, unspecified: Secondary | ICD-10-CM | POA: Diagnosis not present

## 2015-04-12 ENCOUNTER — Ambulatory Visit
Admission: RE | Admit: 2015-04-12 | Discharge: 2015-04-12 | Disposition: A | Discharge status: Home / Self Care | Payer: TRICARE For Life (TFL) | Source: Ambulatory Visit | Attending: Internal Medicine | Admitting: Internal Medicine

## 2015-04-12 DIAGNOSIS — I714 Abdominal aortic aneurysm, without rupture, unspecified: Secondary | ICD-10-CM

## 2015-05-02 DIAGNOSIS — E119 Type 2 diabetes mellitus without complications: Secondary | ICD-10-CM | POA: Diagnosis not present

## 2015-05-20 DIAGNOSIS — H04129 Dry eye syndrome of unspecified lacrimal gland: Secondary | ICD-10-CM | POA: Diagnosis not present

## 2015-05-27 DIAGNOSIS — Z7984 Long term (current) use of oral hypoglycemic drugs: Secondary | ICD-10-CM | POA: Diagnosis not present

## 2015-05-27 DIAGNOSIS — Y92838 Other recreation area as the place of occurrence of the external cause: Secondary | ICD-10-CM | POA: Diagnosis not present

## 2015-05-27 DIAGNOSIS — S0990XA Unspecified injury of head, initial encounter: Secondary | ICD-10-CM | POA: Diagnosis not present

## 2015-05-27 DIAGNOSIS — E119 Type 2 diabetes mellitus without complications: Secondary | ICD-10-CM | POA: Diagnosis not present

## 2015-05-31 DIAGNOSIS — S060X9A Concussion with loss of consciousness of unspecified duration, initial encounter: Secondary | ICD-10-CM | POA: Diagnosis not present

## 2015-05-31 DIAGNOSIS — Z09 Encounter for follow-up examination after completed treatment for conditions other than malignant neoplasm: Secondary | ICD-10-CM | POA: Diagnosis not present

## 2015-07-25 DIAGNOSIS — E119 Type 2 diabetes mellitus without complications: Secondary | ICD-10-CM | POA: Diagnosis not present

## 2015-08-16 DIAGNOSIS — H35372 Puckering of macula, left eye: Secondary | ICD-10-CM | POA: Diagnosis not present

## 2015-09-21 ENCOUNTER — Encounter: Payer: Self-pay | Admitting: Podiatry

## 2015-09-21 ENCOUNTER — Ambulatory Visit (INDEPENDENT_AMBULATORY_CARE_PROVIDER_SITE_OTHER): Payer: Medicare Other

## 2015-09-21 ENCOUNTER — Ambulatory Visit (INDEPENDENT_AMBULATORY_CARE_PROVIDER_SITE_OTHER): Payer: Medicare Other | Admitting: Podiatry

## 2015-09-21 VITALS — BP 124/84 | HR 86 | Resp 12

## 2015-09-21 DIAGNOSIS — M722 Plantar fascial fibromatosis: Secondary | ICD-10-CM

## 2015-09-21 DIAGNOSIS — M79672 Pain in left foot: Secondary | ICD-10-CM

## 2015-09-21 NOTE — Patient Instructions (Signed)
Plantar Fasciitis Plantar fasciitis is a painful foot condition that affects the heel. It occurs when the band of tissue that connects the toes to the heel bone (plantar fascia) becomes irritated. This can happen after exercising too much or doing other repetitive activities (overuse injury). The pain from plantar fasciitis can range from mild irritation to severe pain that makes it difficult for you to walk or move. The pain is usually worse in the morning or after you have been sitting or lying down for a while. CAUSES This condition may be caused by:  Standing for long periods of time.  Wearing shoes that do not fit.  Doing high-impact activities, including running, aerobics, and ballet.  Being overweight.  Having an abnormal way of walking (gait).  Having tight calf muscles.  Having high arches in your feet.  Starting a new athletic activity. SYMPTOMS The main symptom of this condition is heel pain. Other symptoms include:  Pain that gets worse after activity or exercise.  Pain that is worse in the morning or after resting.  Pain that goes away after you walk for a few minutes. DIAGNOSIS This condition may be diagnosed based on your signs and symptoms. Your health care provider will also do a physical exam to check for:  A tender area on the bottom of your foot.  A high arch in your foot.  Pain when you move your foot.  Difficulty moving your foot. You may also need to have imaging studies to confirm the diagnosis. These can include:  X-rays.  Ultrasound.  MRI. TREATMENT  Treatment for plantar fasciitis depends on the severity of the condition. Your treatment may include:  Rest, ice, and over-the-counter pain medicines to manage your pain.  Exercises to stretch your calves and your plantar fascia.  A splint that holds your foot in a stretched, upward position while you sleep (night splint).  Physical therapy to relieve symptoms and prevent problems in the  future.  Cortisone injections to relieve severe pain.  Extracorporeal shock wave therapy (ESWT) to stimulate damaged plantar fascia with electrical impulses. It is often used as a last resort before surgery.  Surgery, if other treatments have not worked after 12 months. HOME CARE INSTRUCTIONS  Take medicines only as directed by your health care provider.  Avoid activities that cause pain.  Roll the bottom of your foot over a bag of ice or a bottle of cold water. Do this for 20 minutes, 3-4 times a day.  Perform simple stretches as directed by your health care provider.  Try wearing athletic shoes with air-sole or gel-sole cushions or soft shoe inserts.  Wear a night splint while sleeping, if directed by your health care provider.  Keep all follow-up appointments with your health care provider. PREVENTION   Do not perform exercises or activities that cause heel pain.  Consider finding low-impact activities if you continue to have problems.  Lose weight if you need to. The best way to prevent plantar fasciitis is to avoid the activities that aggravate your plantar fascia. SEEK MEDICAL CARE IF:  Your symptoms do not go away after treatment with home care measures.  Your pain gets worse.  Your pain affects your ability to move or do your daily activities.   This information is not intended to replace advice given to you by your health care provider. Make sure you discuss any questions you have with your health care provider.   Document Released: 12/12/2000 Document Revised: 12/08/2014 Document Reviewed: 01/27/2014 Elsevier   Interactive Patient Education 2016 [employer]  

## 2015-09-21 NOTE — Progress Notes (Signed)
   Subjective:    Patient ID: Christopher Sandoval, male    DOB: 06/20/1940, 75 y.o.   MRN: [id number]  HPI  S  This patient presents today complaining of left inferior heel pain that activates with standing and walking and reduces with rest and elevation. The symptoms are gradually worsening over the past 3 months. Patient swears athletic style shoes and an accommodative orthotic. He is quite active on a daily basis.  Patient is a diabetic and denies any history of foot ulceration, claudication or amputation  Review of Systems  Musculoskeletal: Positive for gait problem.       Objective:   Physical Exam   Expand All Collapse All     Subjective:    Patient ID: Christopher Sandoval, male DOB: 09/18/1940, 75 y.o. MRN: [id number]  HPI    This patient presents today complaining of a skin lesion in the plantar left mid foot that has reoccurred in the past 1-1/2 years after removal by Dr. Valentina Lucks in in 2014. Patient has been applying topical wart remover to the area for several months and the lesion has persisted. He has interested in possible removal of this lesion. Patient states that he is quite active on a daily basis and this lesion does not prevent him from performing his daily physical activities which including cycling and weight lifting. Patient is diabetic and denies any history of ulceration, claudication or amputation   Review of Systems  All other systems reviewed and are negative.      Objective:   Physical Exam  Orientated 3  Vascular: No peripheral edema bilaterally DP and PT pulses 2/4 bilaterally Capillary reflex immediate bilaterally  Neurological: Sensation to 10 g monofilament wire intact 5/5 right 4/5 left Vibratory sensation nonreactive bilaterally Ankle reflexes reactive bilaterally  Dermatological: Texture and turgor within normal limits Nucleated plantar keratoses left midfoot several millimeters in diameter. There is slight bleeding within the  area. After debridement, however, no further bleeding noted within the lesion  Musculoskeletal: Flexible pes planus bilaterally Crossover second right toe Mild palpable tenderness medial plantar insertional area left without any palpable lesions Atrophic fat-pad heels bilaterally  Pes planus bilaterally        X-ray examination weightbearing left foot dated 09/21/2015  Intact bony structure without fracture and/or dislocation Pes planus Plantarflexed talus Forefoot abducted on rear foot Hammertoe second Inferior calcaneal spur  Radiographic impression: No acute bony abnormality noted left foot sagittal and transverse Plaine flatfoot, left foot      Assessment & Plan:   Assessment: Type II diabetic with satisfactory vascular status Decreased vibratory sensation peripheral neuropathy Plantar fasciitis left 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 I made him aware that he had plantar fasciitis of the left heel associated with the flatfoot and probably aggravated with the atrophic fat-pad. I encouraged him to continue wearing the orthotics on an ongoing basis and maintain stretching. I offered him a Kenalog Injection for the plantar fasciitis and he declined the injection Gen. information about plantar fasciitis provided to patient in the after visit summary  Reappoint at patient's request

## 2015-10-05 DIAGNOSIS — E1122 Type 2 diabetes mellitus with diabetic chronic kidney disease: Secondary | ICD-10-CM | POA: Diagnosis not present

## 2015-10-05 DIAGNOSIS — N400 Enlarged prostate without lower urinary tract symptoms: Secondary | ICD-10-CM | POA: Diagnosis not present

## 2015-10-05 DIAGNOSIS — E785 Hyperlipidemia, unspecified: Secondary | ICD-10-CM | POA: Diagnosis not present

## 2015-10-12 DIAGNOSIS — N182 Chronic kidney disease, stage 2 (mild): Secondary | ICD-10-CM | POA: Diagnosis not present

## 2015-10-12 DIAGNOSIS — E1122 Type 2 diabetes mellitus with diabetic chronic kidney disease: Secondary | ICD-10-CM | POA: Diagnosis not present

## 2015-10-12 DIAGNOSIS — E785 Hyperlipidemia, unspecified: Secondary | ICD-10-CM | POA: Diagnosis not present

## 2015-10-12 DIAGNOSIS — I714 Abdominal aortic aneurysm, without rupture: Secondary | ICD-10-CM | POA: Diagnosis not present

## 2015-10-25 DIAGNOSIS — I10 Essential (primary) hypertension: Secondary | ICD-10-CM | POA: Diagnosis not present

## 2015-10-25 DIAGNOSIS — E119 Type 2 diabetes mellitus without complications: Secondary | ICD-10-CM | POA: Diagnosis not present

## 2015-10-25 DIAGNOSIS — E782 Mixed hyperlipidemia: Secondary | ICD-10-CM | POA: Diagnosis not present

## 2015-11-16 DIAGNOSIS — H1131 Conjunctival hemorrhage, right eye: Secondary | ICD-10-CM | POA: Diagnosis not present

## 2015-11-25 ENCOUNTER — Other Ambulatory Visit: Payer: Self-pay

## 2015-12-12 DIAGNOSIS — Z23 Encounter for immunization: Secondary | ICD-10-CM | POA: Diagnosis not present

## 2016-03-06 DIAGNOSIS — E119 Type 2 diabetes mellitus without complications: Secondary | ICD-10-CM | POA: Diagnosis not present

## 2016-03-06 DIAGNOSIS — I10 Essential (primary) hypertension: Secondary | ICD-10-CM | POA: Diagnosis not present

## 2016-03-06 DIAGNOSIS — E782 Mixed hyperlipidemia: Secondary | ICD-10-CM | POA: Diagnosis not present

## 2016-03-14 DIAGNOSIS — Z85828 Personal history of other malignant neoplasm of skin: Secondary | ICD-10-CM | POA: Diagnosis not present

## 2016-03-14 DIAGNOSIS — D225 Melanocytic nevi of trunk: Secondary | ICD-10-CM | POA: Diagnosis not present

## 2016-03-14 DIAGNOSIS — L570 Actinic keratosis: Secondary | ICD-10-CM | POA: Diagnosis not present

## 2016-03-14 DIAGNOSIS — L814 Other melanin hyperpigmentation: Secondary | ICD-10-CM | POA: Diagnosis not present

## 2016-03-14 DIAGNOSIS — L821 Other seborrheic keratosis: Secondary | ICD-10-CM | POA: Diagnosis not present

## 2016-04-03 DIAGNOSIS — Z125 Encounter for screening for malignant neoplasm of prostate: Secondary | ICD-10-CM | POA: Diagnosis not present

## 2016-04-03 DIAGNOSIS — E1122 Type 2 diabetes mellitus with diabetic chronic kidney disease: Secondary | ICD-10-CM | POA: Diagnosis not present

## 2016-04-03 DIAGNOSIS — E785 Hyperlipidemia, unspecified: Secondary | ICD-10-CM | POA: Diagnosis not present

## 2016-04-10 DIAGNOSIS — N529 Male erectile dysfunction, unspecified: Secondary | ICD-10-CM | POA: Diagnosis not present

## 2016-04-10 DIAGNOSIS — N182 Chronic kidney disease, stage 2 (mild): Secondary | ICD-10-CM | POA: Diagnosis not present

## 2016-04-10 DIAGNOSIS — E1122 Type 2 diabetes mellitus with diabetic chronic kidney disease: Secondary | ICD-10-CM | POA: Diagnosis not present

## 2016-04-10 DIAGNOSIS — I714 Abdominal aortic aneurysm, without rupture: Secondary | ICD-10-CM | POA: Diagnosis not present

## 2016-04-10 DIAGNOSIS — G250 Essential tremor: Secondary | ICD-10-CM | POA: Diagnosis not present

## 2016-05-22 DIAGNOSIS — H04129 Dry eye syndrome of unspecified lacrimal gland: Secondary | ICD-10-CM | POA: Diagnosis not present

## 2016-06-04 DIAGNOSIS — E782 Mixed hyperlipidemia: Secondary | ICD-10-CM | POA: Diagnosis not present

## 2016-06-04 DIAGNOSIS — E119 Type 2 diabetes mellitus without complications: Secondary | ICD-10-CM | POA: Diagnosis not present

## 2016-06-04 DIAGNOSIS — I10 Essential (primary) hypertension: Secondary | ICD-10-CM | POA: Diagnosis not present

## 2016-06-04 DIAGNOSIS — E1129 Type 2 diabetes mellitus with other diabetic kidney complication: Secondary | ICD-10-CM | POA: Diagnosis not present

## 2016-08-21 DIAGNOSIS — H40013 Open angle with borderline findings, low risk, bilateral: Secondary | ICD-10-CM | POA: Diagnosis not present

## 2016-10-11 DIAGNOSIS — E119 Type 2 diabetes mellitus without complications: Secondary | ICD-10-CM | POA: Diagnosis not present

## 2016-10-11 DIAGNOSIS — E782 Mixed hyperlipidemia: Secondary | ICD-10-CM | POA: Diagnosis not present

## 2016-10-11 DIAGNOSIS — E1129 Type 2 diabetes mellitus with other diabetic kidney complication: Secondary | ICD-10-CM | POA: Diagnosis not present

## 2016-10-11 DIAGNOSIS — I10 Essential (primary) hypertension: Secondary | ICD-10-CM | POA: Diagnosis not present

## 2016-10-18 DIAGNOSIS — E119 Type 2 diabetes mellitus without complications: Secondary | ICD-10-CM | POA: Diagnosis not present

## 2016-10-18 DIAGNOSIS — R4189 Other symptoms and signs involving cognitive functions and awareness: Secondary | ICD-10-CM | POA: Diagnosis not present

## 2016-10-18 DIAGNOSIS — Z0001 Encounter for general adult medical examination with abnormal findings: Secondary | ICD-10-CM | POA: Diagnosis not present

## 2016-10-18 DIAGNOSIS — Z789 Other specified health status: Secondary | ICD-10-CM | POA: Diagnosis not present

## 2016-10-18 DIAGNOSIS — Z719 Counseling, unspecified: Secondary | ICD-10-CM | POA: Diagnosis not present

## 2016-10-18 DIAGNOSIS — Z1389 Encounter for screening for other disorder: Secondary | ICD-10-CM | POA: Diagnosis not present

## 2016-10-18 DIAGNOSIS — Z125 Encounter for screening for malignant neoplasm of prostate: Secondary | ICD-10-CM | POA: Diagnosis not present

## 2016-10-18 DIAGNOSIS — Z1211 Encounter for screening for malignant neoplasm of colon: Secondary | ICD-10-CM | POA: Diagnosis not present

## 2016-11-01 DIAGNOSIS — R319 Hematuria, unspecified: Secondary | ICD-10-CM | POA: Diagnosis not present

## 2016-11-01 DIAGNOSIS — R4189 Other symptoms and signs involving cognitive functions and awareness: Secondary | ICD-10-CM | POA: Diagnosis not present

## 2016-11-01 DIAGNOSIS — R51 Headache: Secondary | ICD-10-CM | POA: Diagnosis not present

## 2016-11-02 DIAGNOSIS — H40003 Preglaucoma, unspecified, bilateral: Secondary | ICD-10-CM | POA: Diagnosis not present

## 2016-11-02 DIAGNOSIS — H04123 Dry eye syndrome of bilateral lacrimal glands: Secondary | ICD-10-CM | POA: Diagnosis not present

## 2016-11-02 DIAGNOSIS — E119 Type 2 diabetes mellitus without complications: Secondary | ICD-10-CM | POA: Diagnosis not present

## 2016-11-02 DIAGNOSIS — Z961 Presence of intraocular lens: Secondary | ICD-10-CM | POA: Diagnosis not present

## 2016-11-05 DIAGNOSIS — Z789 Other specified health status: Secondary | ICD-10-CM | POA: Diagnosis not present

## 2016-11-05 DIAGNOSIS — Z719 Counseling, unspecified: Secondary | ICD-10-CM | POA: Diagnosis not present

## 2016-11-05 DIAGNOSIS — R4189 Other symptoms and signs involving cognitive functions and awareness: Secondary | ICD-10-CM | POA: Diagnosis not present

## 2016-11-05 DIAGNOSIS — E119 Type 2 diabetes mellitus without complications: Secondary | ICD-10-CM | POA: Diagnosis not present

## 2016-11-05 DIAGNOSIS — R3129 Other microscopic hematuria: Secondary | ICD-10-CM | POA: Diagnosis not present

## 2017-01-21 DIAGNOSIS — Z79899 Other long term (current) drug therapy: Secondary | ICD-10-CM | POA: Diagnosis not present

## 2017-01-21 DIAGNOSIS — G3184 Mild cognitive impairment, so stated: Secondary | ICD-10-CM | POA: Diagnosis not present

## 2017-01-21 DIAGNOSIS — Z1159 Encounter for screening for other viral diseases: Secondary | ICD-10-CM | POA: Diagnosis not present

## 2017-01-21 DIAGNOSIS — R269 Unspecified abnormalities of gait and mobility: Secondary | ICD-10-CM | POA: Diagnosis not present

## 2017-01-21 DIAGNOSIS — R251 Tremor, unspecified: Secondary | ICD-10-CM | POA: Diagnosis not present

## 2017-01-28 DIAGNOSIS — E1129 Type 2 diabetes mellitus with other diabetic kidney complication: Secondary | ICD-10-CM | POA: Diagnosis not present

## 2017-01-28 DIAGNOSIS — I10 Essential (primary) hypertension: Secondary | ICD-10-CM | POA: Diagnosis not present

## 2017-01-28 DIAGNOSIS — E782 Mixed hyperlipidemia: Secondary | ICD-10-CM | POA: Diagnosis not present

## 2017-01-28 DIAGNOSIS — E119 Type 2 diabetes mellitus without complications: Secondary | ICD-10-CM | POA: Diagnosis not present

## 2017-01-31 IMAGING — US US AORTA
1 series · 14 of 25 positions shown · non-contrast
Comparison: No prior.

CLINICAL DATA: Abdominal aortic aneurysm.

EXAM:
ULTRASOUND OF ABDOMINAL AORTA
TECHNIQUE: Ultrasound examination of the abdominal aorta was performed to
evaluate for abdominal aortic aneurysm.

[Series 1: us aorta · 0.26mm/px · 14 of 29 slices shown]
[im 1/29]
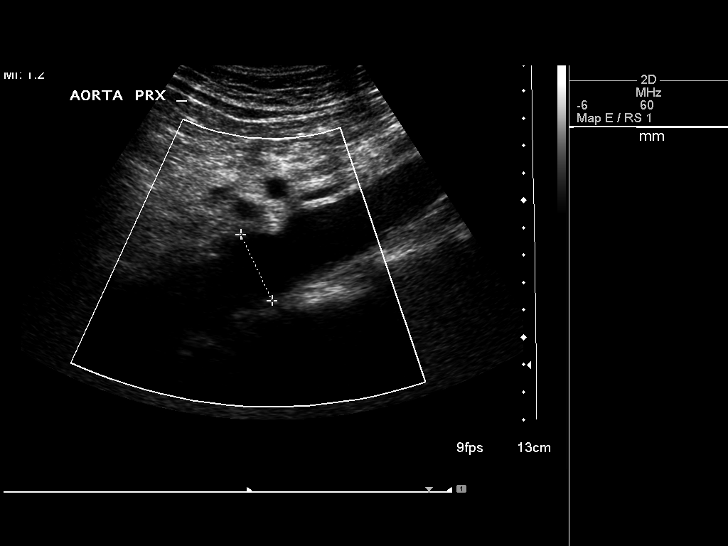
[im 3/29]
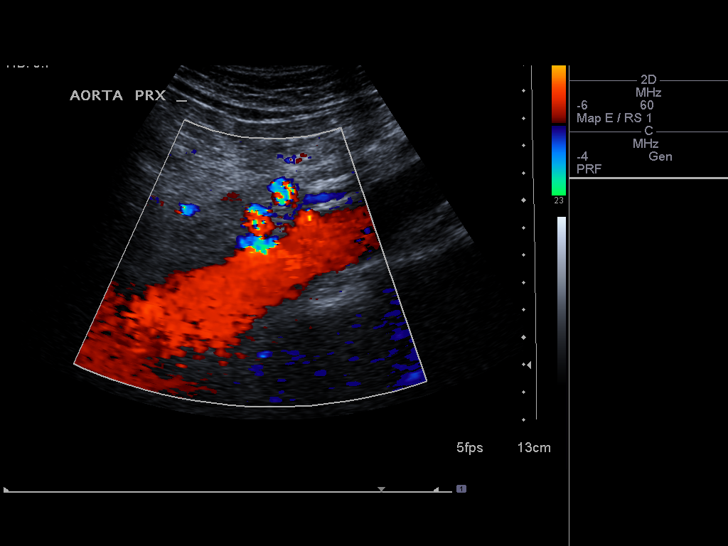
[im 5/29]
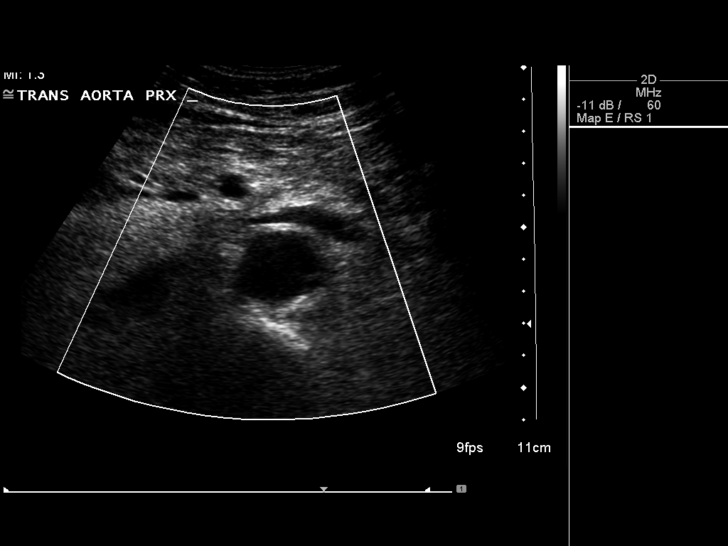
[im 8/29]
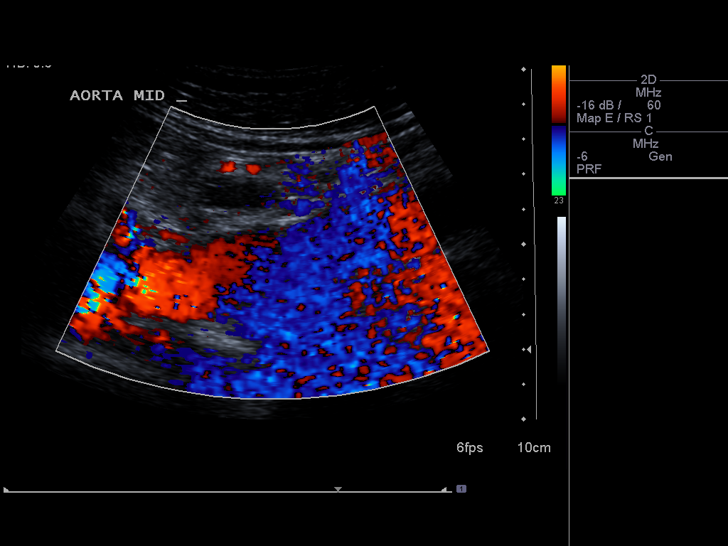
[im 10/29]
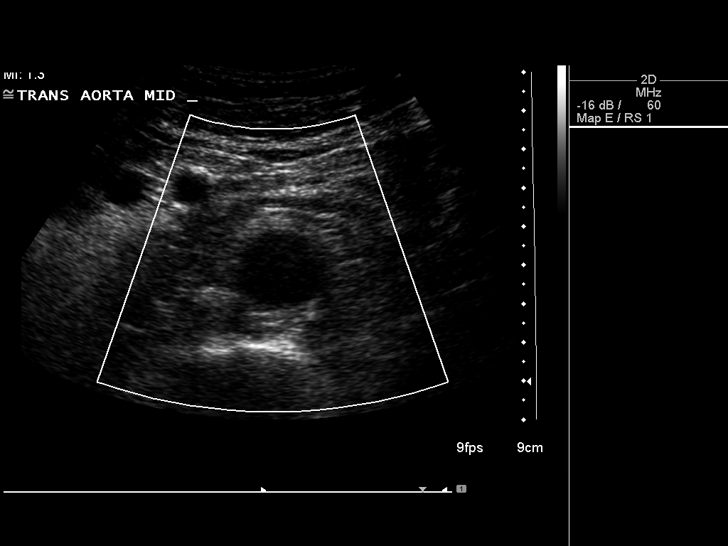
[im 11/29]
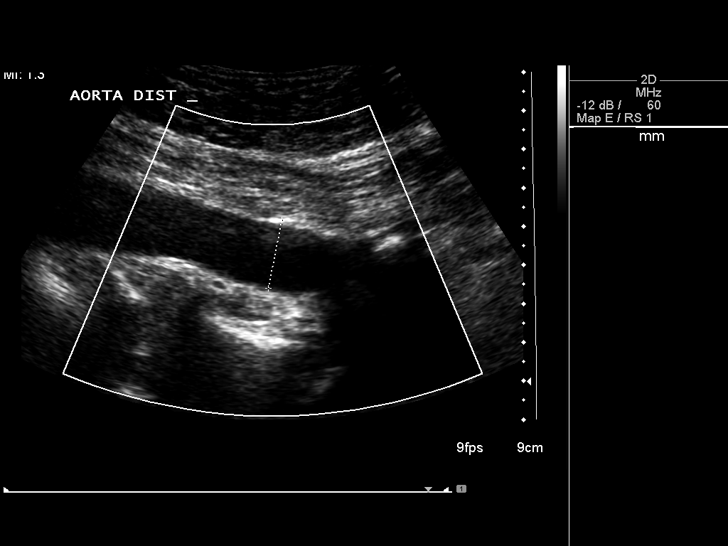
[im 13/29]
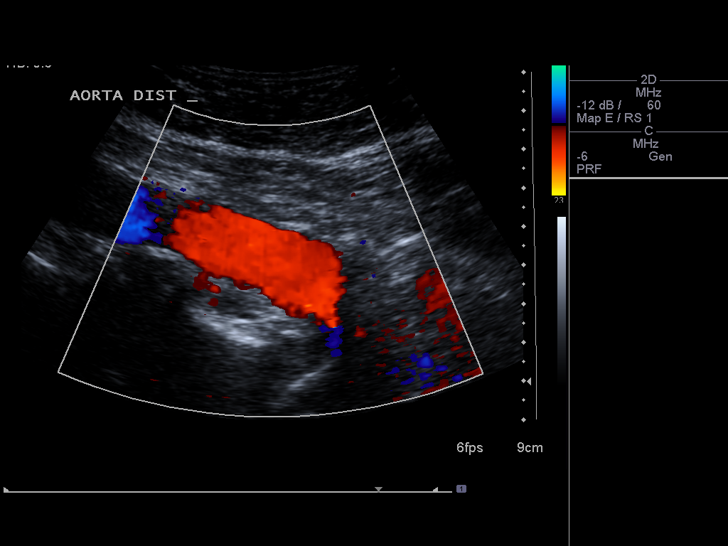
[im 16/29]
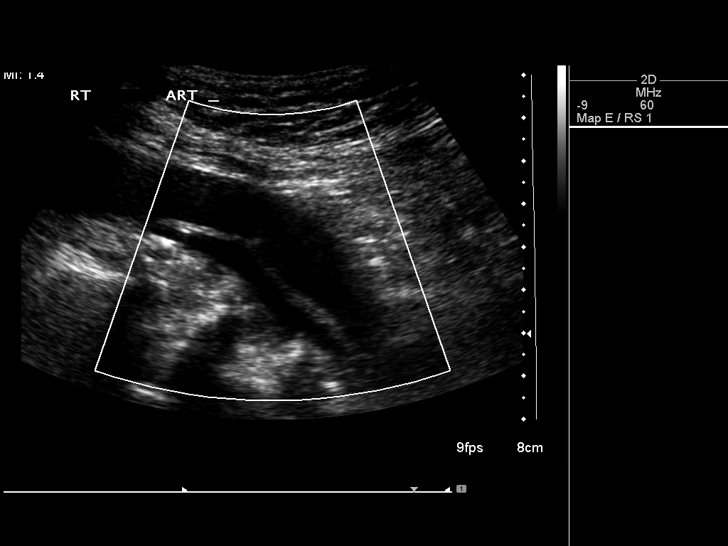
[im 18/29]
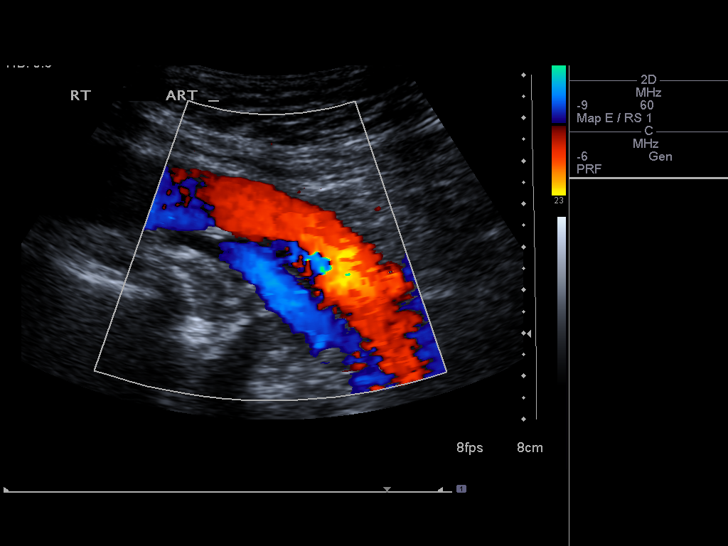
[im 19/29]
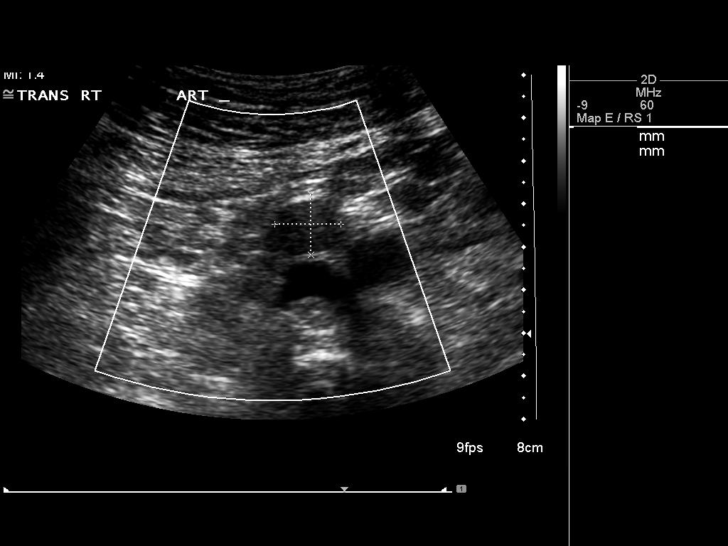
[im 22/29]
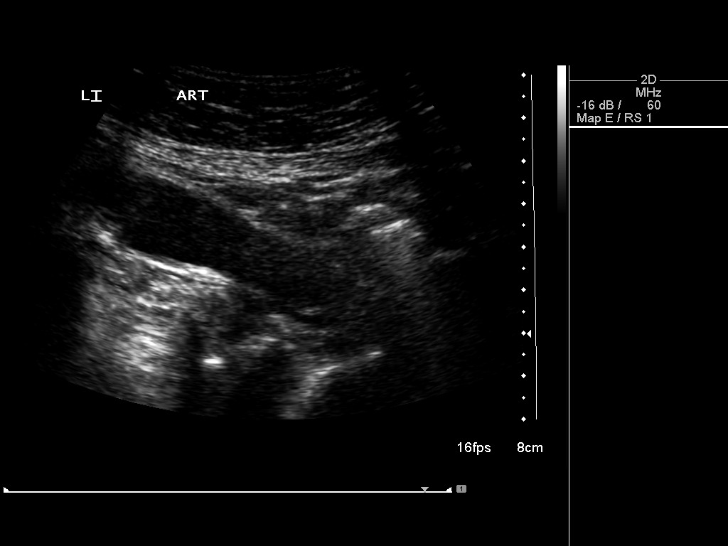
[im 24/29]
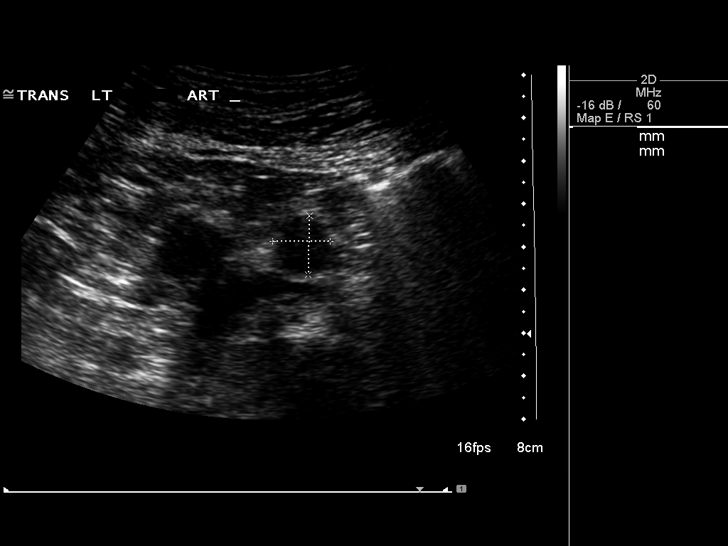
[im 26/29]
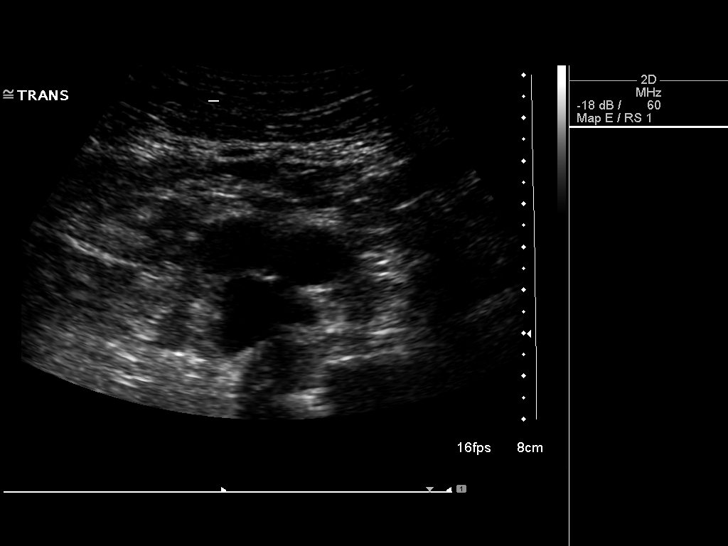
[im 29/29]
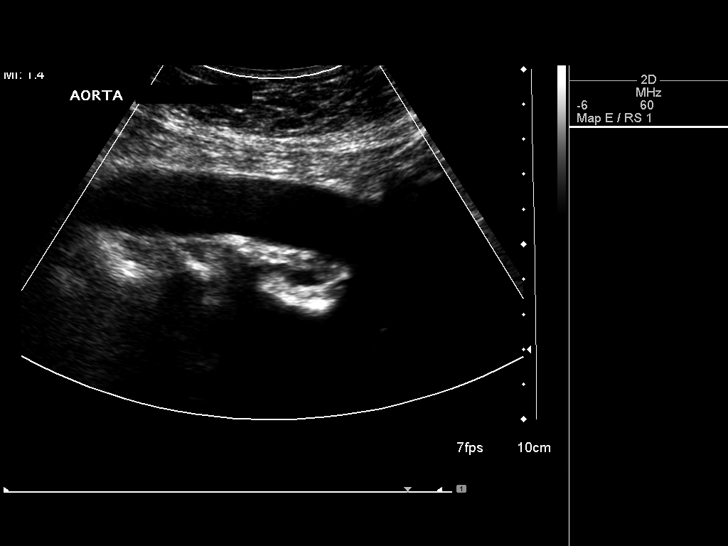

[14 of 25 positions shown; findings below may reference images not displayed]

FINDINGS: Abdominal Aorta

Mild abdominal aortic ectasia to 2.7 cm.

Maximum Diameter: 2.7 cm
IMPRESSION: Mild abdominal aortic ectasia 2.7 cm. Ectatic abdominal aorta at
risk for aneurysm development. Recommend followup by ultrasound in 5
years. This recommendation follows ACR consensus guidelines: White
Paper of the ACR Incidental Findings Committee II on Vascular
Findings. [HOSPITAL] 9421; [DATE].

## 2017-02-05 DIAGNOSIS — E785 Hyperlipidemia, unspecified: Secondary | ICD-10-CM | POA: Diagnosis not present

## 2017-02-05 DIAGNOSIS — E119 Type 2 diabetes mellitus without complications: Secondary | ICD-10-CM | POA: Diagnosis not present

## 2017-02-05 DIAGNOSIS — R4189 Other symptoms and signs involving cognitive functions and awareness: Secondary | ICD-10-CM | POA: Diagnosis not present

## 2017-02-05 DIAGNOSIS — Z789 Other specified health status: Secondary | ICD-10-CM | POA: Diagnosis not present

## 2017-02-05 DIAGNOSIS — Z719 Counseling, unspecified: Secondary | ICD-10-CM | POA: Diagnosis not present

## 2017-02-05 DIAGNOSIS — G250 Essential tremor: Secondary | ICD-10-CM | POA: Diagnosis not present

## 2017-02-05 DIAGNOSIS — R3129 Other microscopic hematuria: Secondary | ICD-10-CM | POA: Diagnosis not present

## 2017-02-11 DIAGNOSIS — Z79899 Other long term (current) drug therapy: Secondary | ICD-10-CM | POA: Diagnosis not present

## 2017-02-11 DIAGNOSIS — R930 Abnormal findings on diagnostic imaging of skull and head, not elsewhere classified: Secondary | ICD-10-CM | POA: Diagnosis not present

## 2017-02-11 DIAGNOSIS — Q283 Other malformations of cerebral vessels: Secondary | ICD-10-CM | POA: Diagnosis not present

## 2017-02-11 DIAGNOSIS — G3184 Mild cognitive impairment, so stated: Secondary | ICD-10-CM | POA: Diagnosis not present

## 2017-02-11 DIAGNOSIS — R251 Tremor, unspecified: Secondary | ICD-10-CM | POA: Diagnosis not present

## 2017-02-11 DIAGNOSIS — Z1159 Encounter for screening for other viral diseases: Secondary | ICD-10-CM | POA: Diagnosis not present

## 2017-02-11 DIAGNOSIS — G3189 Other specified degenerative diseases of nervous system: Secondary | ICD-10-CM | POA: Diagnosis not present

## 2017-02-11 DIAGNOSIS — Z5181 Encounter for therapeutic drug level monitoring: Secondary | ICD-10-CM | POA: Diagnosis not present

## 2017-04-12 DIAGNOSIS — H40003 Preglaucoma, unspecified, bilateral: Secondary | ICD-10-CM | POA: Diagnosis not present

## 2017-04-29 DIAGNOSIS — I679 Cerebrovascular disease, unspecified: Secondary | ICD-10-CM | POA: Diagnosis not present

## 2017-04-29 DIAGNOSIS — R251 Tremor, unspecified: Secondary | ICD-10-CM | POA: Diagnosis not present

## 2017-04-29 DIAGNOSIS — G3184 Mild cognitive impairment, so stated: Secondary | ICD-10-CM | POA: Diagnosis not present

## 2017-05-08 DIAGNOSIS — Z789 Other specified health status: Secondary | ICD-10-CM | POA: Diagnosis not present

## 2017-05-08 DIAGNOSIS — G250 Essential tremor: Secondary | ICD-10-CM | POA: Diagnosis not present

## 2017-05-08 DIAGNOSIS — Z6824 Body mass index (BMI) 24.0-24.9, adult: Secondary | ICD-10-CM | POA: Diagnosis not present

## 2017-05-08 DIAGNOSIS — Z719 Counseling, unspecified: Secondary | ICD-10-CM | POA: Diagnosis not present

## 2017-05-08 DIAGNOSIS — E119 Type 2 diabetes mellitus without complications: Secondary | ICD-10-CM | POA: Diagnosis not present

## 2017-05-08 DIAGNOSIS — R4189 Other symptoms and signs involving cognitive functions and awareness: Secondary | ICD-10-CM | POA: Diagnosis not present

## 2017-05-08 DIAGNOSIS — R3129 Other microscopic hematuria: Secondary | ICD-10-CM | POA: Diagnosis not present

## 2017-05-08 DIAGNOSIS — E785 Hyperlipidemia, unspecified: Secondary | ICD-10-CM | POA: Diagnosis not present

## 2017-05-14 DIAGNOSIS — H401131 Primary open-angle glaucoma, bilateral, mild stage: Secondary | ICD-10-CM | POA: Diagnosis not present

## 2017-05-30 DIAGNOSIS — E119 Type 2 diabetes mellitus without complications: Secondary | ICD-10-CM | POA: Diagnosis not present

## 2017-05-30 DIAGNOSIS — H401131 Primary open-angle glaucoma, bilateral, mild stage: Secondary | ICD-10-CM | POA: Diagnosis not present

## 2017-05-31 DIAGNOSIS — R413 Other amnesia: Secondary | ICD-10-CM | POA: Diagnosis not present

## 2017-06-13 DIAGNOSIS — G250 Essential tremor: Secondary | ICD-10-CM | POA: Diagnosis not present

## 2017-06-13 DIAGNOSIS — E119 Type 2 diabetes mellitus without complications: Secondary | ICD-10-CM | POA: Diagnosis not present

## 2017-06-13 DIAGNOSIS — E785 Hyperlipidemia, unspecified: Secondary | ICD-10-CM | POA: Diagnosis not present

## 2017-06-14 DIAGNOSIS — H40003 Preglaucoma, unspecified, bilateral: Secondary | ICD-10-CM | POA: Diagnosis not present

## 2017-07-09 DIAGNOSIS — G3184 Mild cognitive impairment, so stated: Secondary | ICD-10-CM | POA: Diagnosis not present

## 2017-07-09 DIAGNOSIS — I679 Cerebrovascular disease, unspecified: Secondary | ICD-10-CM | POA: Diagnosis not present

## 2017-07-09 DIAGNOSIS — R251 Tremor, unspecified: Secondary | ICD-10-CM | POA: Diagnosis not present

## 2017-07-12 DIAGNOSIS — Z961 Presence of intraocular lens: Secondary | ICD-10-CM | POA: Diagnosis not present

## 2017-07-12 DIAGNOSIS — H401111 Primary open-angle glaucoma, right eye, mild stage: Secondary | ICD-10-CM | POA: Diagnosis not present

## 2017-07-12 DIAGNOSIS — H401122 Primary open-angle glaucoma, left eye, moderate stage: Secondary | ICD-10-CM | POA: Diagnosis not present

## 2017-07-29 DIAGNOSIS — H401122 Primary open-angle glaucoma, left eye, moderate stage: Secondary | ICD-10-CM | POA: Diagnosis not present

## 2017-08-05 DIAGNOSIS — H401111 Primary open-angle glaucoma, right eye, mild stage: Secondary | ICD-10-CM | POA: Diagnosis not present

## 2017-08-08 DIAGNOSIS — R4189 Other symptoms and signs involving cognitive functions and awareness: Secondary | ICD-10-CM | POA: Diagnosis not present

## 2017-08-08 DIAGNOSIS — Z789 Other specified health status: Secondary | ICD-10-CM | POA: Diagnosis not present

## 2017-08-08 DIAGNOSIS — Z6823 Body mass index (BMI) 23.0-23.9, adult: Secondary | ICD-10-CM | POA: Diagnosis not present

## 2017-08-08 DIAGNOSIS — E785 Hyperlipidemia, unspecified: Secondary | ICD-10-CM | POA: Diagnosis not present

## 2017-08-08 DIAGNOSIS — R3129 Other microscopic hematuria: Secondary | ICD-10-CM | POA: Diagnosis not present

## 2017-08-08 DIAGNOSIS — G250 Essential tremor: Secondary | ICD-10-CM | POA: Diagnosis not present

## 2017-08-08 DIAGNOSIS — Z719 Counseling, unspecified: Secondary | ICD-10-CM | POA: Diagnosis not present

## 2017-08-08 DIAGNOSIS — E119 Type 2 diabetes mellitus without complications: Secondary | ICD-10-CM | POA: Diagnosis not present

## 2017-09-16 DIAGNOSIS — E119 Type 2 diabetes mellitus without complications: Secondary | ICD-10-CM | POA: Diagnosis not present

## 2017-09-18 DIAGNOSIS — E785 Hyperlipidemia, unspecified: Secondary | ICD-10-CM | POA: Diagnosis not present

## 2017-09-18 DIAGNOSIS — E1121 Type 2 diabetes mellitus with diabetic nephropathy: Secondary | ICD-10-CM | POA: Diagnosis not present

## 2017-09-18 DIAGNOSIS — G250 Essential tremor: Secondary | ICD-10-CM | POA: Diagnosis not present

## 2017-09-19 DIAGNOSIS — H401111 Primary open-angle glaucoma, right eye, mild stage: Secondary | ICD-10-CM | POA: Diagnosis not present

## 2017-09-19 DIAGNOSIS — H401122 Primary open-angle glaucoma, left eye, moderate stage: Secondary | ICD-10-CM | POA: Diagnosis not present

## 2017-11-04 DIAGNOSIS — H401111 Primary open-angle glaucoma, right eye, mild stage: Secondary | ICD-10-CM | POA: Diagnosis not present
